# Patient Record
Sex: Male | Born: 1970 | Race: White | Hispanic: No | Marital: Married | Smoking: Never smoker
Health system: Southern US, Community
[De-identification: ages and names within clinical notes are randomized; demographics above are authoritative.]

---

## 1997-10-03 ENCOUNTER — Emergency Department (HOSPITAL_COMMUNITY): Admission: EM | Admit: 1997-10-03 | Discharge: 1997-10-03 | Payer: Self-pay | Admitting: Emergency Medicine

## 1997-10-04 ENCOUNTER — Emergency Department (HOSPITAL_COMMUNITY): Admission: EM | Admit: 1997-10-04 | Discharge: 1997-10-04 | Payer: Self-pay | Admitting: Emergency Medicine

## 1997-10-06 ENCOUNTER — Observation Stay (HOSPITAL_COMMUNITY): Admission: EM | Admit: 1997-10-06 | Discharge: 1997-10-07 | Payer: Self-pay | Admitting: Emergency Medicine

## 1997-10-20 ENCOUNTER — Ambulatory Visit (HOSPITAL_COMMUNITY): Admission: RE | Admit: 1997-10-20 | Discharge: 1997-10-20 | Payer: Self-pay | Admitting: Urology

## 1997-10-28 ENCOUNTER — Emergency Department (HOSPITAL_COMMUNITY): Admission: EM | Admit: 1997-10-28 | Discharge: 1997-10-28 | Payer: Self-pay | Admitting: Emergency Medicine

## 1997-11-12 ENCOUNTER — Ambulatory Visit (HOSPITAL_COMMUNITY): Admission: RE | Admit: 1997-11-12 | Discharge: 1997-11-12 | Payer: Self-pay | Admitting: Urology

## 1998-12-03 ENCOUNTER — Encounter: Payer: Self-pay | Admitting: Urology

## 1998-12-03 ENCOUNTER — Ambulatory Visit (HOSPITAL_COMMUNITY): Admission: RE | Admit: 1998-12-03 | Discharge: 1998-12-03 | Payer: Self-pay | Admitting: Urology

## 1999-11-17 ENCOUNTER — Encounter: Payer: Self-pay | Admitting: Urology

## 1999-11-17 ENCOUNTER — Encounter: Admission: RE | Admit: 1999-11-17 | Discharge: 1999-11-17 | Payer: Self-pay | Admitting: Urology

## 2003-07-27 ENCOUNTER — Emergency Department (HOSPITAL_COMMUNITY): Admission: EM | Admit: 2003-07-27 | Discharge: 2003-07-27 | Payer: Self-pay | Admitting: Family Medicine

## 2003-07-31 ENCOUNTER — Emergency Department (HOSPITAL_COMMUNITY): Admission: EM | Admit: 2003-07-31 | Discharge: 2003-07-31 | Payer: Self-pay | Admitting: Family Medicine

## 2007-05-19 ENCOUNTER — Emergency Department (HOSPITAL_COMMUNITY): Admission: EM | Admit: 2007-05-19 | Discharge: 2007-05-19 | Payer: Self-pay | Admitting: Emergency Medicine

## 2008-10-07 IMAGING — CR DG CHEST 2V
2 series · 2 of 2 positions shown · non-contrast
Comparison: None available.

CLINICAL DATA: Chest pain with left arm numbness. 
 CHEST - 2 VIEW:

[w chest pa]
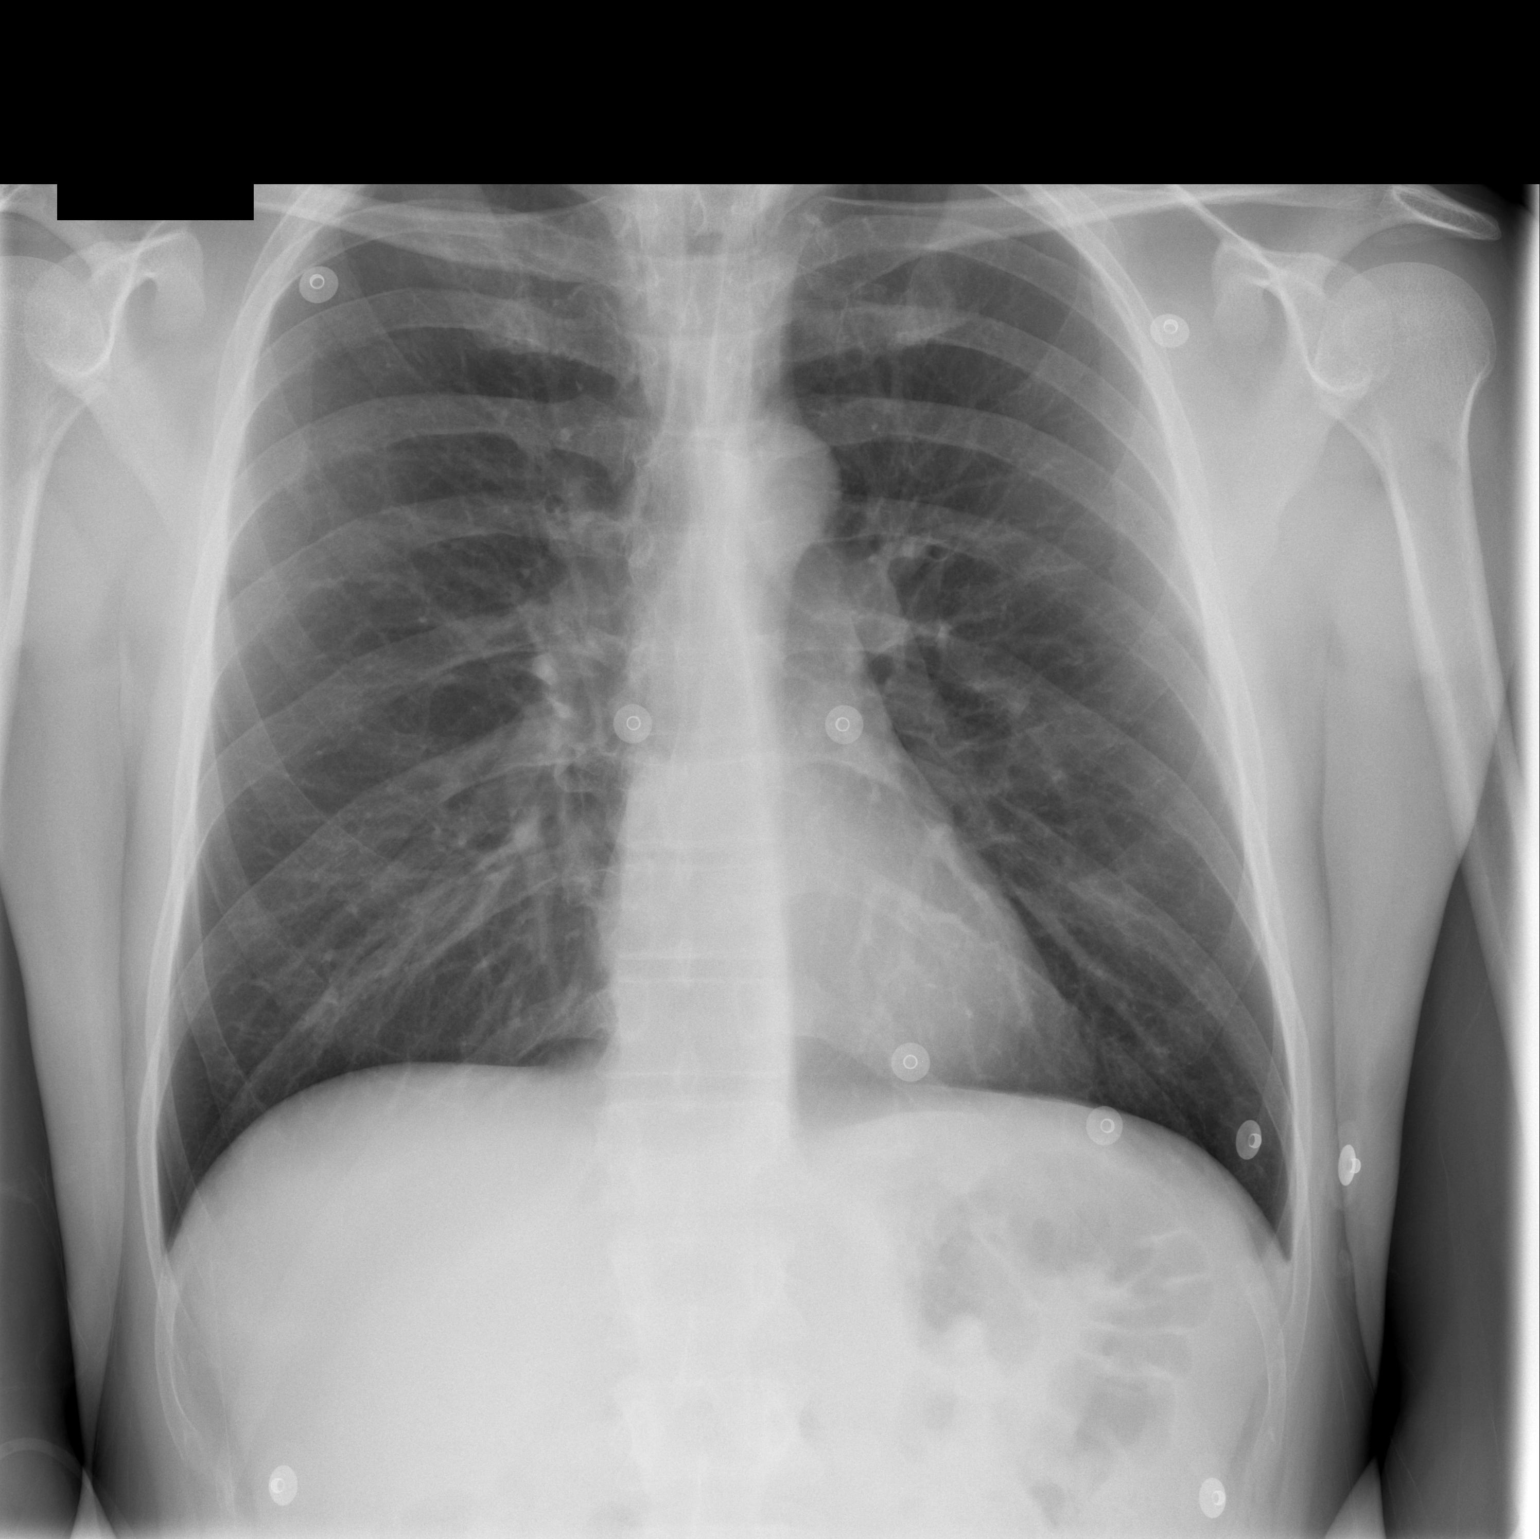

[w chest lat]
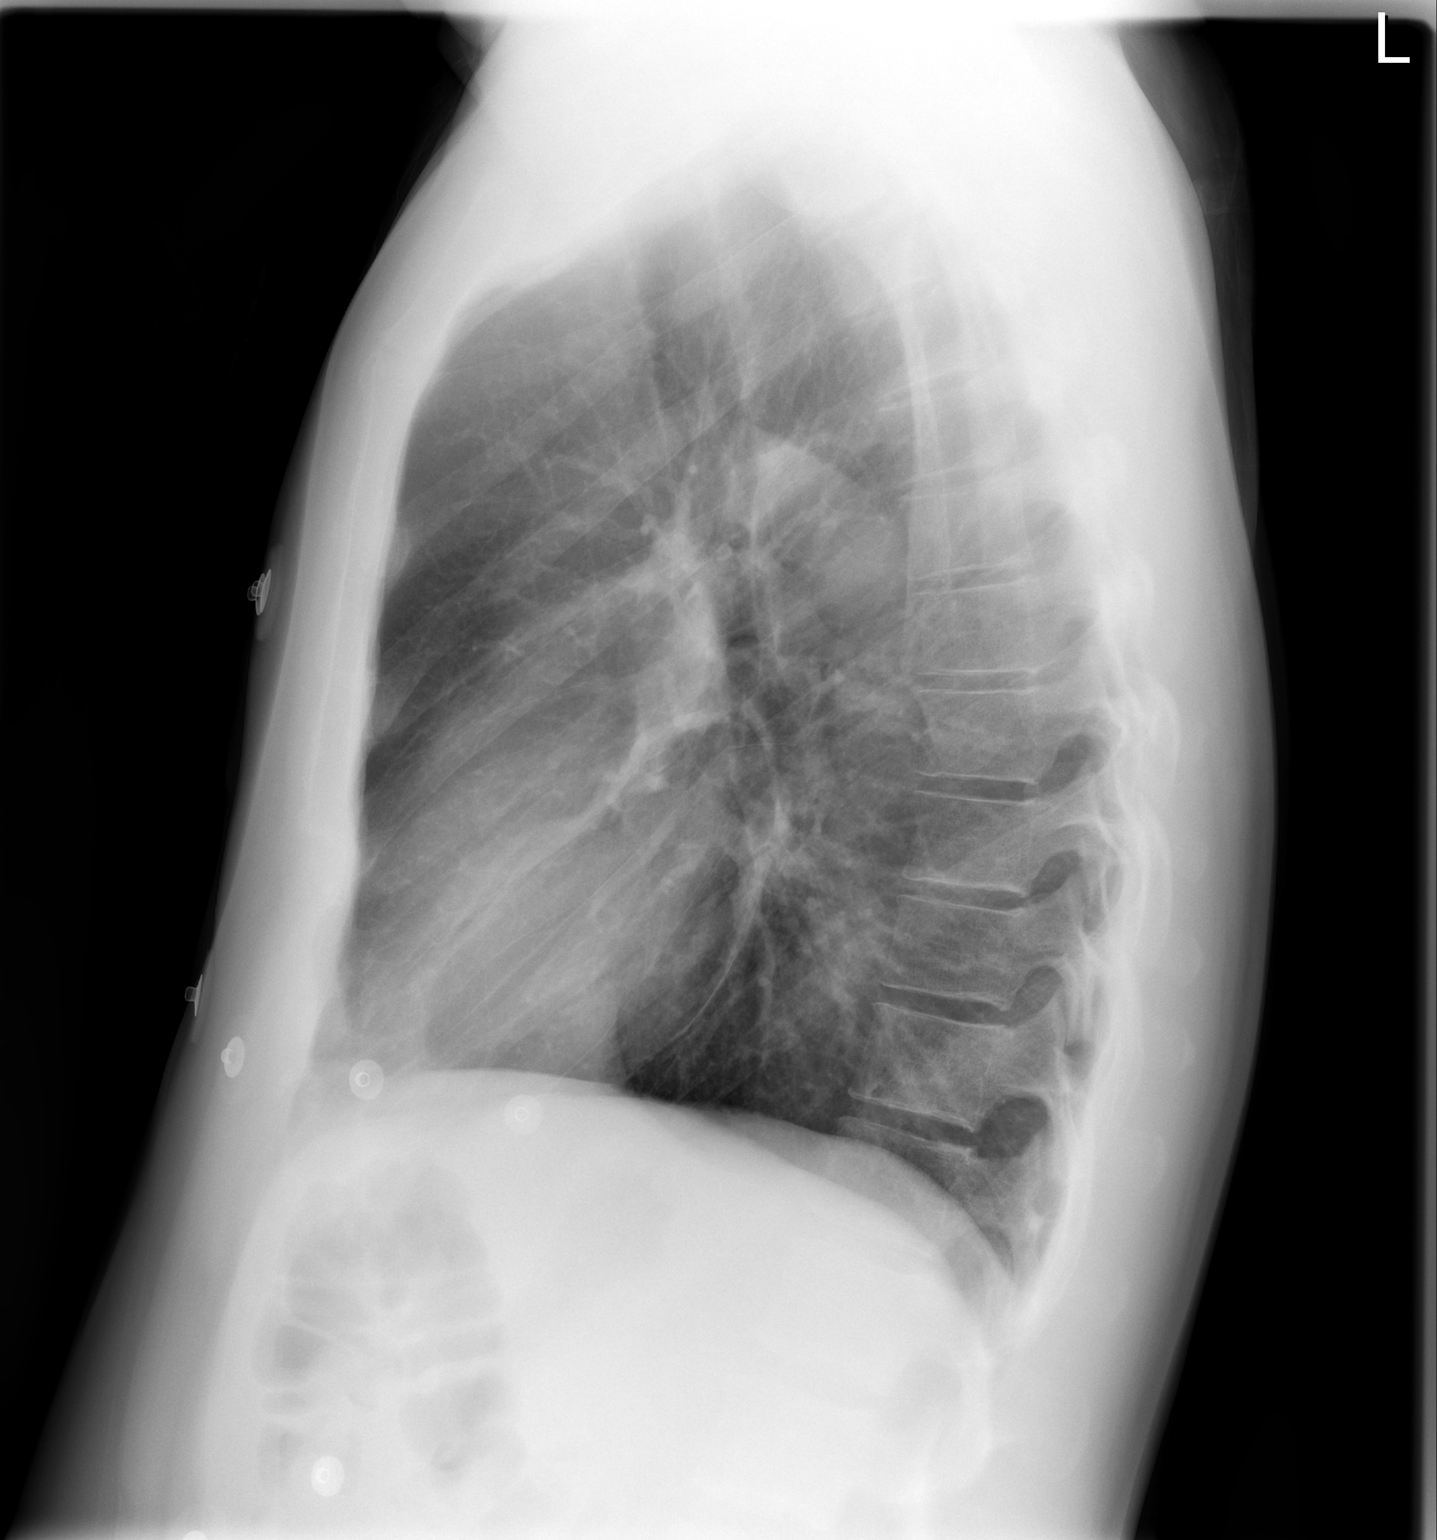

[2 of 2 positions shown; findings below may reference images not displayed]

FINDINGS: The heart size and mediastinal contours are within normal limits.  Both lungs are clear.  The visualized skeletal structures are unremarkable.
IMPRESSION: No active cardiopulmonary disease.

## 2010-12-24 LAB — DIFFERENTIAL
Basophils Relative: 0
Lymphs Abs: 1.9
Monocytes Relative: 9
Neutro Abs: 4
Neutrophils Relative %: 59

## 2010-12-24 LAB — I-STAT 8, (EC8 V) (CONVERTED LAB)
Acid-base deficit: 1
Potassium: 3.8
Sodium: 139
TCO2: 26
pH, Ven: 7.368 — ABNORMAL HIGH

## 2010-12-24 LAB — POCT CARDIAC MARKERS
CKMB, poc: <1 — ABNORMAL LOW
CKMB, poc: <1 — ABNORMAL LOW
Myoglobin, poc: 37.4
Troponin i, poc: <0.05
Troponin i, poc: <0.05

## 2010-12-24 LAB — CBC
HCT: 44.3
Hemoglobin: 14.8
MCHC: 33.3
MCV: 85.6
Platelets: 283
RBC: 5.18
RDW: 12.6
WBC: 6.7

## 2010-12-24 LAB — POCT I-STAT CREATININE
Creatinine, Ser: 0.9
Operator id: 284251

## 2019-11-14 ENCOUNTER — Emergency Department (INDEPENDENT_AMBULATORY_CARE_PROVIDER_SITE_OTHER): Payer: BC Managed Care – PPO

## 2019-11-14 ENCOUNTER — Other Ambulatory Visit: Payer: Self-pay

## 2019-11-14 ENCOUNTER — Emergency Department (INDEPENDENT_AMBULATORY_CARE_PROVIDER_SITE_OTHER)
Admission: RE | Admit: 2019-11-14 | Discharge: 2019-11-14 | Disposition: A | Discharge status: Home / Self Care | Payer: BC Managed Care – PPO | Source: Ambulatory Visit

## 2019-11-14 VITALS — BP 137/88 | HR 72 | Temp 99.1°F | Ht 70.0 in | Wt 190.0 lb

## 2019-11-14 DIAGNOSIS — J069 Acute upper respiratory infection, unspecified: Secondary | ICD-10-CM | POA: Diagnosis not present

## 2019-11-14 DIAGNOSIS — R0789 Other chest pain: Secondary | ICD-10-CM

## 2019-11-14 NOTE — ED Triage Notes (Signed)
Sneezing, sore throat, cough, tightness in chest started 4 hours ago. Has NOT been vaccinated

## 2019-11-14 NOTE — ED Provider Notes (Signed)
[name]    CSN: [id number] Arrival date & time: 11/14/19  1641      History   Chief Complaint Chief Complaint  Patient presents with  . Sore Throat    HPI Bryan Mack is a 49 y.o. male.   HPI  Bryan Mack is a 49 y.o. male presenting to UC with c/o sneezing, sore throat, cough, chest tightness that started about 4 hours ago. He has not received the Covid-19 vaccine, requesting to be tested. Denies fever, chills, n/v/d. No known sick contacts.   History reviewed. No pertinent past medical history.  There are no problems to display for this patient.   History reviewed. No pertinent surgical history.     Home Medications    Prior to Admission medications   Not on File    Family History Family History  Problem Relation Age of Onset  . Allergies Mother     Social History Social History   Tobacco Use  . Smoking status: Never Smoker  . Smokeless tobacco: Never Used  Vaping Use  . Vaping Use: Never used  Substance Use Topics  . Alcohol use: Not Currently  . Drug use: Not on file     Allergies   Patient has no known allergies.   Review of Systems Review of Systems  Constitutional: Negative for chills and fever.  HENT: Positive for congestion and sneezing. Negative for ear pain, sore throat, trouble swallowing and voice change.   Respiratory: Positive for cough and chest tightness. Negative for shortness of breath.   Cardiovascular: Negative for chest pain and palpitations.  Gastrointestinal: Negative for abdominal pain, diarrhea, nausea and vomiting.  Musculoskeletal: Negative for arthralgias, back pain and myalgias.  Skin: Negative for rash.  Neurological: Positive for headaches. Negative for dizziness and light-headedness.  All other systems reviewed and are negative.    Physical Exam Triage Vital Signs ED Triage Vitals  Enc Vitals Group     BP 11/14/19 1707 137/88     Pulse Rate 11/14/19 1707 72     Resp --      Temp  11/14/19 1707 99.1 F (37.3 C)     Temp Source 11/14/19 1707 Oral     SpO2 11/14/19 1707 95 %     Weight 11/14/19 1708 190 lb (86.2 kg)     Height 11/14/19 1708 5\' 10"  (1.778 m)     Head Circumference --      Peak Flow --      Pain Score 11/14/19 1707 0     Pain Loc --      Pain Edu? --      Excl. in GC? --    No data found.  Updated Vital Signs BP 137/88 (BP Location: Right Arm)   Pulse 72   Temp 99.1 F (37.3 C) (Oral)   Ht 5\' 10"  (1.778 m)   Wt 190 lb (86.2 kg)   SpO2 95%   BMI 27.26 kg/m   Visual Acuity Right Eye Distance:   Left Eye Distance:   Bilateral Distance:    Right Eye Near:   Left Eye Near:    Bilateral Near:     Physical Exam Vitals and nursing note reviewed.  Constitutional:      General: He is not in acute distress.    Appearance: He is well-developed. He is not ill-appearing, toxic-appearing or diaphoretic.  HENT:     Head: Normocephalic and atraumatic.     Right Ear: Tympanic membrane and ear canal  normal.     Left Ear: Tympanic membrane and ear canal normal.     Nose: Nose normal.     Mouth/Throat:     Lips: Pink.     Mouth: Mucous membranes are moist.     Pharynx: Oropharynx is clear. Uvula midline.  Cardiovascular:     Rate and Rhythm: Normal rate and regular rhythm.  Pulmonary:     Effort: Pulmonary effort is normal. No respiratory distress.     Breath sounds: No stridor. Wheezing (mild wheeze) present. No rhonchi or rales.     Comments: Faint diffuse, no respiratory distress.  Musculoskeletal:        General: Normal range of motion.     Cervical back: Normal range of motion and neck supple.  Lymphadenopathy:     Cervical: No cervical adenopathy.  Skin:    General: Skin is warm and dry.  Neurological:     Mental Status: He is alert and oriented to person, place, and time.  Psychiatric:        Behavior: Behavior normal.      UC Treatments / Results  Labs (all labs ordered are listed, but only abnormal results are  displayed) Labs Reviewed - No data to display  EKG   Radiology DG Chest 2 View  Result Date: 11/14/2019 CLINICAL DATA:  49 year old male with chest pain. EXAM: CHEST - 2 VIEW COMPARISON:  Chest radiograph dated 05/19/2007. FINDINGS: The heart size and mediastinal contours are within normal limits. Both lungs are clear. The visualized skeletal structures are unremarkable. IMPRESSION: No active cardiopulmonary disease. Electronically Signed   By: Elgie Collard M.D.   On: 11/14/2019 18:26    Procedures Procedures (including critical care time)  Medications Ordered in UC Medications - No data to display  Initial Impression / Assessment and Plan / UC Course  I have reviewed the triage vital signs and the nursing notes.  Pertinent labs & imaging results that were available during my care of the patient were reviewed by me and considered in my medical decision making (see chart for details).     Hx and exam c/w viral illness Covid-19 test: pending Encouraged symptomatic tx F/u with PCP  AVS given  Final Clinical Impressions(s) / UC Diagnoses   Final diagnoses:  Viral URI with cough     Discharge Instructions      Due to concern for possibly having Covid-19, it is advised that you self-isolate at home until test results come back, usually 2-3 days.  If positive, it is recommended you stay isolated for at least 10 days after symptom onset and 24 hours after last fever without taking medication (whichever is longer).  If you MUST go out, please wear a mask at all times, limit contact with others.   If your test is negative, you still have plenty of time to get the Covid vaccine. It is recommended you schedule an appointment to get your vaccine once you get over this current illness.  Please ask your primary care provider about any questions/concerns related to the vaccine.   You may take 500mg  acetaminophen every 4-6 hours or in combination with ibuprofen 400-600mg  every 6-8  hours as needed for pain, inflammation, and fever.  Be sure to well hydrated with clear liquids and get at least 8 hours of sleep at night, preferably more while sick.   Please follow up with family medicine in 1 week if not improving. Call 911 or go to the hospital if symptoms worsening- chest pain,  trouble breathing, passing out.       ED Prescriptions    None     PDMP not reviewed this encounter.   Lurene Shadow, [place] 11/14/19 1849

## 2019-11-14 NOTE — Discharge Instructions (Signed)
°  Due to concern for possibly having Covid-19, it is advised that you self-isolate at home until test results come back, usually 2-3 days.  If positive, it is recommended you stay isolated for at least 10 days after symptom onset and 24 hours after last fever without taking medication (whichever is longer).  If you MUST go out, please wear a mask at all times, limit contact with others.   If your test is negative, you still have plenty of time to get the Covid vaccine. It is recommended you schedule an appointment to get your vaccine once you get over this current illness.  Please ask your primary care provider about any questions/concerns related to the vaccine.   You may take 500mg  acetaminophen every 4-6 hours or in combination with ibuprofen 400-600mg  every 6-8 hours as needed for pain, inflammation, and fever.  Be sure to well hydrated with clear liquids and get at least 8 hours of sleep at night, preferably more while sick.   Please follow up with family medicine in 1 week if not improving. Call 911 or go to the hospital if symptoms worsening- chest pain, trouble breathing, passing out.

## 2019-11-16 LAB — SARS-COV-2, NAA 2 DAY TAT

## 2019-11-16 LAB — NOVEL CORONAVIRUS, NAA: SARS-CoV-2, NAA: NOT DETECTED

## 2021-04-04 IMAGING — DX DG CHEST 2V
2 series · 2 of 2 positions shown · non-contrast
Comparison: Chest radiograph dated 05/19/2007.

CLINICAL DATA: 49-year-old male with chest pain.

EXAM:
CHEST - 2 VIEW

[chest pa]
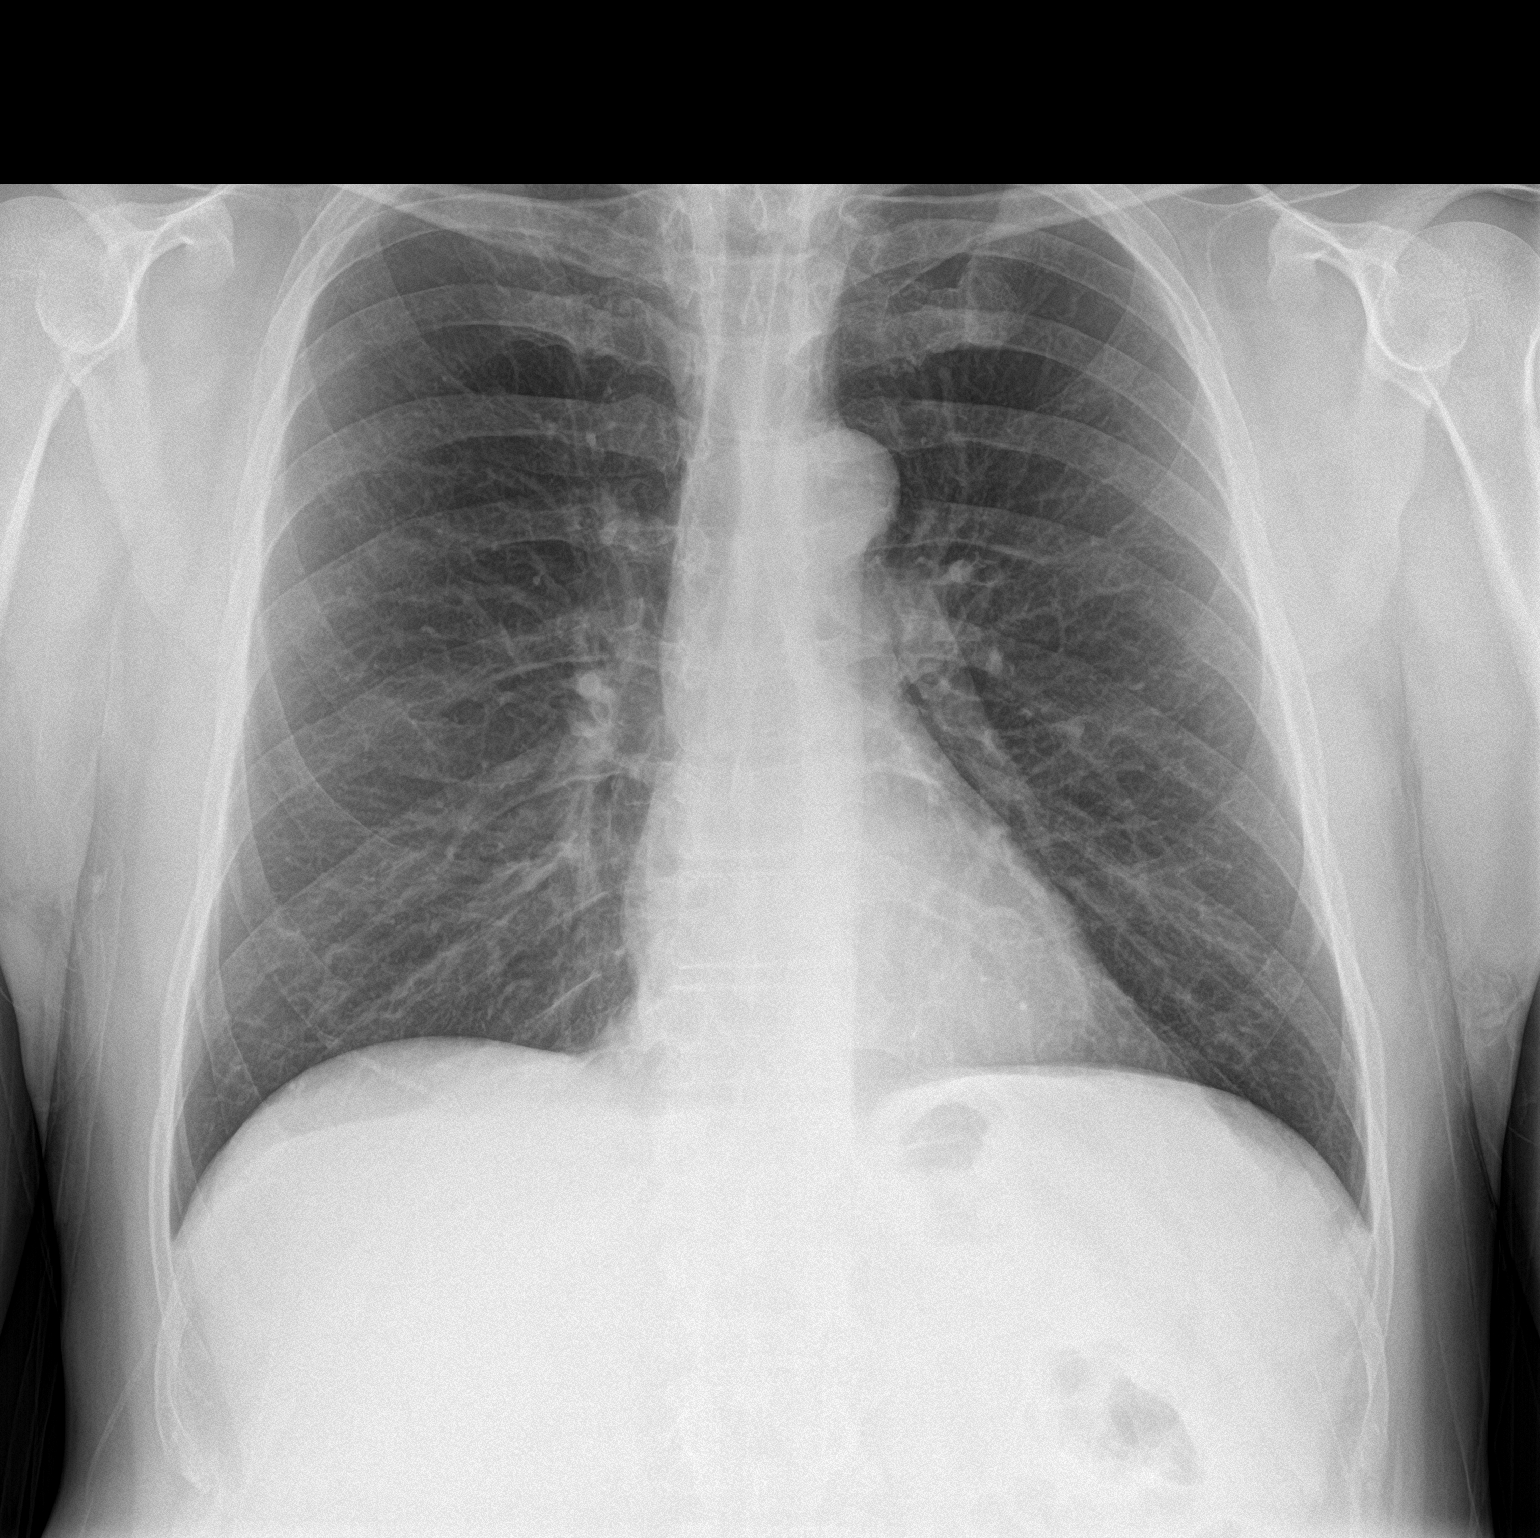

[chest lat]
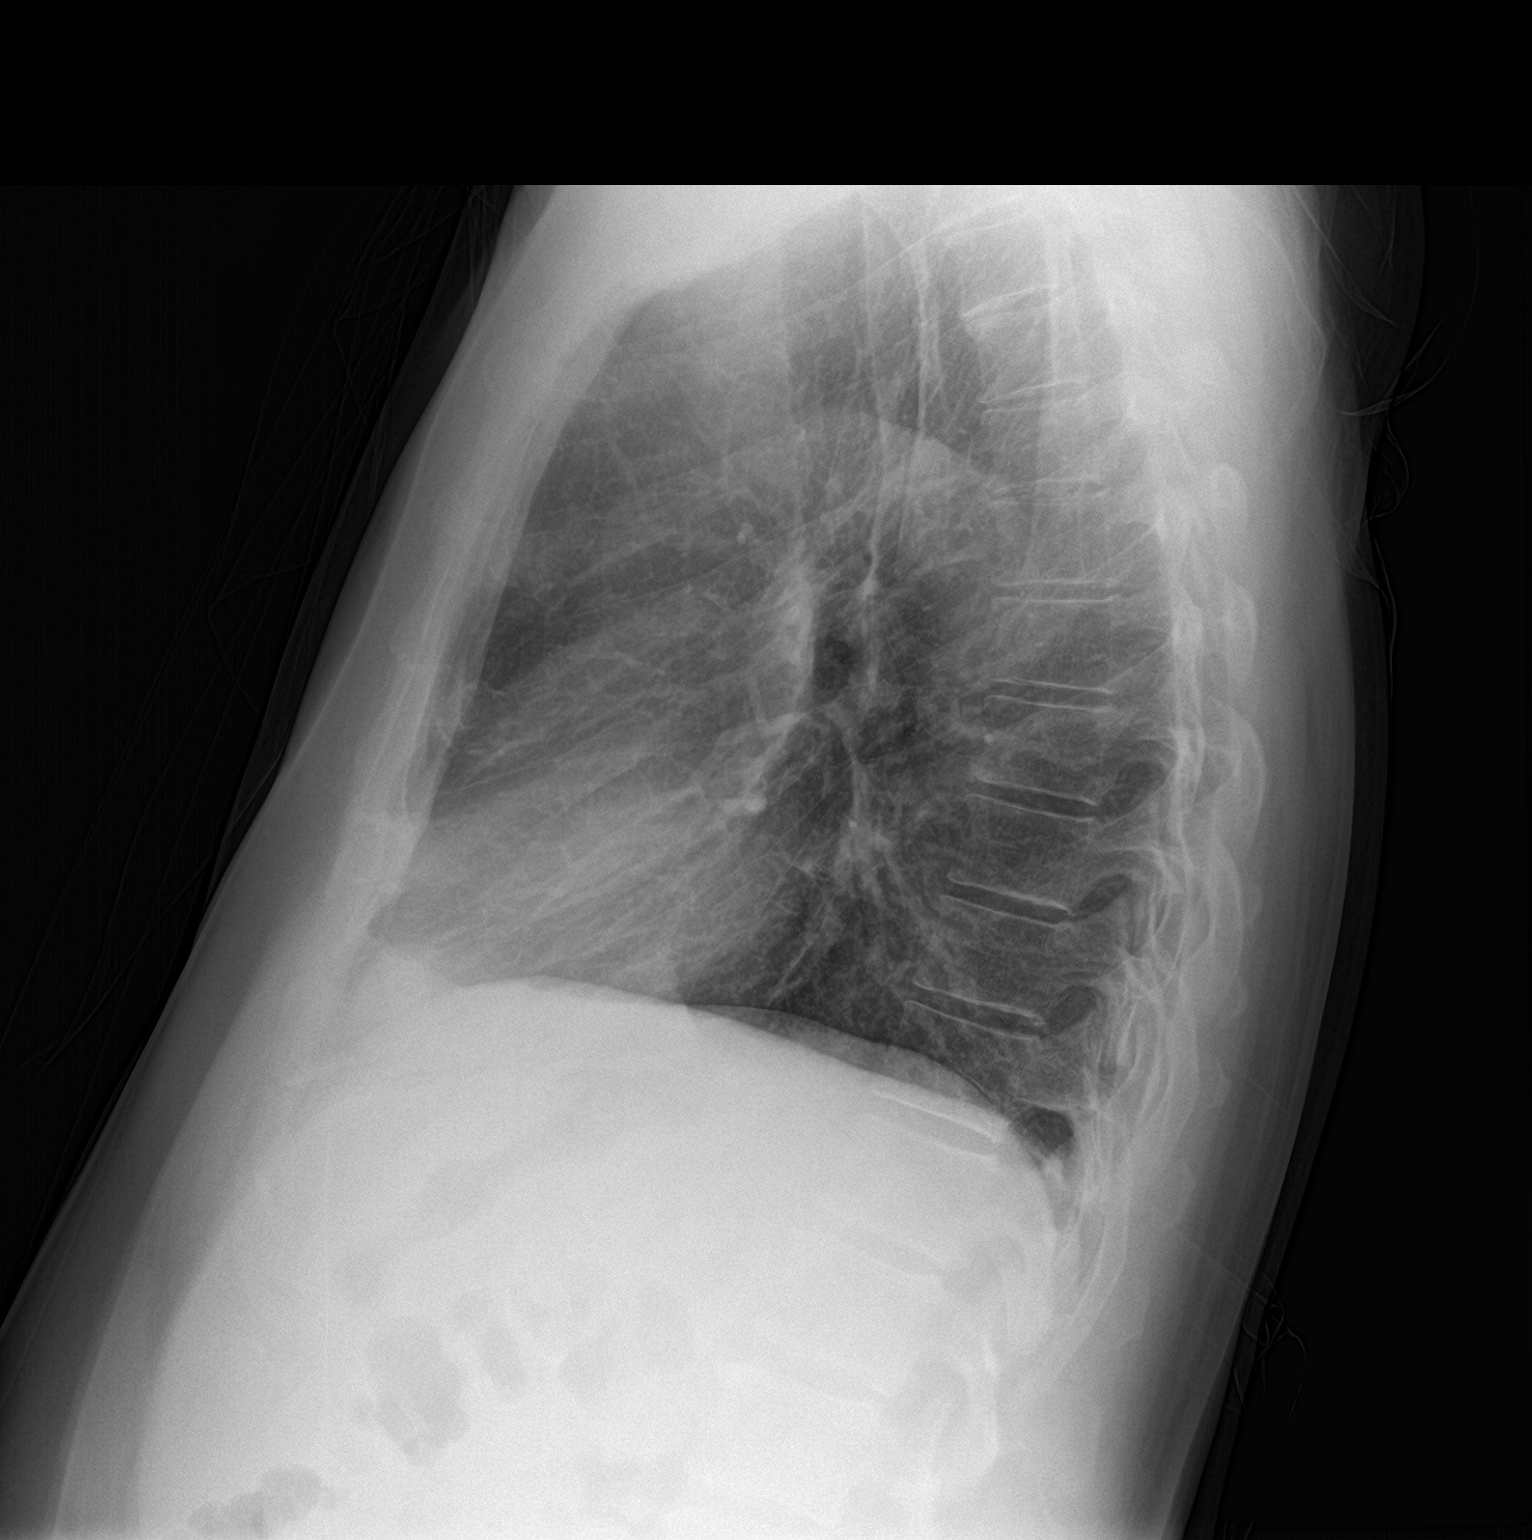

[2 of 2 positions shown; findings below may reference images not displayed]

FINDINGS: The heart size and mediastinal contours are within normal limits.
Both lungs are clear. The visualized skeletal structures are
unremarkable.
IMPRESSION: No active cardiopulmonary disease.
